# Patient Record
Sex: Male | Born: 1990 | Race: Black or African American | Hispanic: No | Marital: Single | Smoking: Never smoker
Health system: Southern US, Community
[De-identification: ages and names within clinical notes are randomized; demographics above are authoritative.]

## PROBLEM LIST (undated history)

## (undated) DIAGNOSIS — B20 Human immunodeficiency virus [HIV] disease: Secondary | ICD-10-CM

---

## 2014-04-01 ENCOUNTER — Emergency Department (HOSPITAL_COMMUNITY)
Admission: EM | Admit: 2014-04-01 | Discharge: 2014-04-01 | Disposition: A | Discharge status: Home / Self Care | Payer: Self-pay | Attending: Emergency Medicine | Admitting: Emergency Medicine

## 2014-04-01 ENCOUNTER — Encounter (HOSPITAL_COMMUNITY): Payer: Self-pay | Admitting: Emergency Medicine

## 2014-04-01 DIAGNOSIS — Z21 Asymptomatic human immunodeficiency virus [HIV] infection status: Secondary | ICD-10-CM | POA: Insufficient documentation

## 2014-04-01 DIAGNOSIS — S0990XA Unspecified injury of head, initial encounter: Secondary | ICD-10-CM | POA: Insufficient documentation

## 2014-04-01 DIAGNOSIS — G44209 Tension-type headache, unspecified, not intractable: Secondary | ICD-10-CM | POA: Insufficient documentation

## 2014-04-01 DIAGNOSIS — Y92410 Unspecified street and highway as the place of occurrence of the external cause: Secondary | ICD-10-CM | POA: Insufficient documentation

## 2014-04-01 DIAGNOSIS — Y9389 Activity, other specified: Secondary | ICD-10-CM | POA: Insufficient documentation

## 2014-04-01 DIAGNOSIS — Z880 Allergy status to penicillin: Secondary | ICD-10-CM | POA: Insufficient documentation

## 2014-04-01 HISTORY — DX: Human immunodeficiency virus (HIV) disease: B20

## 2014-04-01 MED ORDER — NAPROXEN 500 MG PO TABS
500.0000 mg | ORAL_TABLET | Freq: Once | ORAL | Status: AC
  Administered 2014-04-01: 500 mg via ORAL
  Filled 2014-04-01: qty 1

## 2014-04-01 MED ORDER — NAPROXEN 500 MG PO TABS: 500.0000 mg | ORAL_TABLET | Freq: Two times a day (BID) | ORAL | Status: AC
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
## 2014-04-01 NOTE — Discharge Instructions (Signed)
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
## 2014-04-02 NOTE — ED Provider Notes (Signed)
Medical screening examination/treatment/procedure(s) were performed by non-physician practitioner and as supervising physician I was immediately available for consultation/collaboration.    Shanna CiscoMegan E Docherty, MD 04/02/14 0001

## 2015-03-08 ENCOUNTER — Encounter (HOSPITAL_COMMUNITY): Payer: Self-pay | Admitting: Emergency Medicine

## 2015-03-08 ENCOUNTER — Emergency Department (HOSPITAL_COMMUNITY)
Admission: EM | Admit: 2015-03-08 | Discharge: 2015-03-08 | Disposition: A | Discharge status: Home / Self Care | Payer: Self-pay | Attending: Emergency Medicine | Admitting: Emergency Medicine

## 2015-03-08 DIAGNOSIS — R6889 Other general symptoms and signs: Secondary | ICD-10-CM

## 2015-03-08 DIAGNOSIS — R0981 Nasal congestion: Secondary | ICD-10-CM | POA: Insufficient documentation

## 2015-03-08 DIAGNOSIS — R05 Cough: Secondary | ICD-10-CM | POA: Insufficient documentation

## 2015-03-08 DIAGNOSIS — Z79899 Other long term (current) drug therapy: Secondary | ICD-10-CM | POA: Insufficient documentation

## 2015-03-08 DIAGNOSIS — Z21 Asymptomatic human immunodeficiency virus [HIV] infection status: Secondary | ICD-10-CM | POA: Insufficient documentation

## 2015-03-08 DIAGNOSIS — Z791 Long term (current) use of non-steroidal anti-inflammatories (NSAID): Secondary | ICD-10-CM | POA: Insufficient documentation

## 2015-03-08 DIAGNOSIS — R509 Fever, unspecified: Secondary | ICD-10-CM | POA: Insufficient documentation

## 2015-03-08 DIAGNOSIS — Z880 Allergy status to penicillin: Secondary | ICD-10-CM | POA: Insufficient documentation

## 2015-03-08 MED ORDER — DM-GUAIFENESIN ER 30-600 MG PO TB12: 1.0000 | ORAL_TABLET | Freq: Two times a day (BID) | ORAL | Status: AC

## 2015-03-08 MED ORDER — IBUPROFEN 800 MG PO TABS
800.0000 mg | ORAL_TABLET | Freq: Once | ORAL | Status: AC
Start: 2015-03-08 — End: 2015-03-08
  Administered 2015-03-08: 800 mg via ORAL
  Filled 2015-03-08: qty 1

## 2015-03-08 MED ORDER — SALINE SPRAY 0.65 % NA SOLN: 1.0000 | NASAL | Status: AC | PRN

## 2015-03-08 MED ORDER — BENZONATATE 100 MG PO CAPS: 100.0000 mg | ORAL_CAPSULE | Freq: Three times a day (TID) | ORAL | Status: AC

## 2015-03-08 MED ORDER — ACETAMINOPHEN 500 MG PO TABS: 500.0000 mg | ORAL_TABLET | Freq: Four times a day (QID) | ORAL | Status: AC | PRN

## 2015-03-08 NOTE — Discharge Instructions (Signed)

## 2015-03-08 NOTE — ED Provider Notes (Signed)
CSN: [id number]     Arrival date & time 03/08/15  2103 History  This chart was scribed for non-physician practitioner, Antony Madura, PA-C,working with Elwin Mocha, MD, by Karle Plumber, ED Scribe. This patient was seen in room WTR5/WTR5 and the patient's care was started at 10:06 PM.  Chief Complaint  Patient presents with  . Flu like symptoms    The history is provided by the patient and medical records. No language interpreter was used.    HPI Comments:  Ricardo Wade is a 24 y.o. male with PMHx of HIV who presents to the Emergency Department complaining of a productive cough of beige phlegm that began three days ago. He reports associated subjective fever, chills, mild diarrhea, body aches, congestion and HA that began two days ago. He states he has taken Catering manager and Tylenol with minimal relief of the symptoms. Pt reports he has been eating and drinking normally. He denies any modifying factors of the symptoms. He reports having sick contacts from a friend with similar symptoms. Denies nausea, vomiting, sore throat and otalgia. CD4 count > 500  Past Medical History  Diagnosis Date  . HIV (human immunodeficiency virus infection)    History reviewed. No pertinent past surgical history. History reviewed. No pertinent family history. History  Substance Use Topics  . Smoking status: Never Smoker   . Smokeless tobacco: Not on file  . Alcohol Use: No    Review of Systems  Constitutional: Positive for fever and chills.  HENT: Positive for congestion. Negative for ear pain and sore throat.   Respiratory: Positive for cough.   Gastrointestinal: Negative for nausea and vomiting.  All other systems reviewed and are negative.   Allergies  Penicillins  Home Medications   Prior to Admission medications   Medication Sig Start Date End Date Taking? Authorizing Provider  Emtricitab-Rilpivir-Tenofov DF 200-25-300 MG TABS Take 1 tablet by mouth daily.  01/20/15  Yes Historical Provider,  MD  Phenyleph-CPM-DM-Aspirin 7.06-16-09-325 MG TBEF Take 2 tablets by mouth every 6 (six) hours as needed (cough).   Yes Historical Provider, MD  acetaminophen (TYLENOL) 500 MG tablet Take 1 tablet (500 mg total) by mouth every 6 (six) hours as needed. 03/08/15   Antony Madura, PA-C  benzonatate (TESSALON) 100 MG capsule Take 1 capsule (100 mg total) by mouth every 8 (eight) hours. 03/08/15   Antony Madura, PA-C  dextromethorphan-guaiFENesin [facility] DM) 30-600 MG per 12 hr tablet Take 1 tablet by mouth 2 (two) times daily. 03/08/15   Antony Madura, PA-C  naproxen (NAPROSYN) 500 MG tablet Take 1 tablet (500 mg total) by mouth 2 (two) times daily. Patient not taking: Reported on 03/08/2015 04/01/14   Antony Madura, PA-C  sodium chloride (OCEAN) 0.65 % SOLN nasal spray Place 1 spray into both nostrils as needed for congestion. 03/08/15   Antony Madura, PA-C   Triage Vitals: BP 151/76 mmHg  Pulse 98  Temp(Src) 100.5 F (38.1 C) (Oral)  Resp 15  SpO2 100%  Physical Exam  Constitutional: He is oriented to person, place, and time. He appears well-developed and well-nourished. No distress.  Nontoxic/nonseptic appearing  HENT:  Head: Normocephalic and atraumatic.  Nose: Mucosal edema present. No rhinorrhea.  Mouth/Throat: Uvula is midline and oropharynx is clear and moist. No oropharyngeal exudate.  Oropharynx clear. Uvula midline. Patient tolerating secretions without difficulty.  Eyes: Conjunctivae and EOM are normal. Pupils are equal, round, and reactive to light. No scleral icterus.  Neck: Normal range of motion.  No nuchal rigidity or  meningismus  Cardiovascular: Normal rate, regular rhythm and intact distal pulses.   Pulmonary/Chest: Effort normal. No respiratory distress. He has no wheezes. He has no rales.  No accessory muscle use. Lungs clear. Chest expansion symmetric.  Musculoskeletal: Normal range of motion.  Neurological: He is alert and oriented to person, place, and time. He exhibits normal  muscle tone. Coordination normal.  GCS 15. Speech is goal oriented. No focal neurologic deficits appreciated. Patient ambulatory with steady gait.  Skin: Skin is warm and dry. No rash noted. He is not diaphoretic. No erythema. No pallor.  Psychiatric: He has a normal mood and affect. His behavior is normal.  Nursing note and vitals reviewed.   ED Course  Procedures (including critical care time) DIAGNOSTIC STUDIES: Oxygen Saturation is 100% on RA, normal by my interpretation.   COORDINATION OF CARE: 10:11 PM- Will treat symptomatically. Pt verbalizes understanding and agrees to plan.  Medications  ibuprofen (ADVIL,MOTRIN) tablet 800 mg (800 mg Oral Given 03/08/15 2201)    Labs Review Labs Reviewed - No data to display  Imaging Review No results found.   EKG Interpretation None      MDM   Final diagnoses:  Flu-like symptoms    Patient with symptoms consistent with influenza. Vitals are stable, low-grade fever responding well to antipyretics. No signs of dehydration, tolerating PO's. Lungs are clear. Doubt PNA given lack of tachypnea, dyspnea, or hypoxia. Discussed the cost versus benefit of Tamiflu treatment with the patient. The patient understands that symptoms are greater than the recommended 24-48 hour window of treatment.  Patient will be discharged with instructions to orally hydrate, rest, and use over-the-counter medications for fever and body aches. Patient given Tessalon for cough and Mucinex D for congestion. Primary care follow advised and return precautions provided. Patient agreeable to plan with no unaddressed concerns.  I personally performed the services described in this documentation, which was scribed in my presence. The recorded information has been reviewed and is accurate.   Filed Vitals:   03/08/15 2136 03/08/15 2225  BP: 151/76 134/73  Pulse: 98 86  Temp: 100.5 F (38.1 C) 99.3 F (37.4 C)  TempSrc: Oral Oral  Resp: 15 20  SpO2: 100% 95%      Antony MaduraKelly Amen Dargis, PA-C 03/08/15 2302  Elwin MochaBlair Walden, MD 03/08/15 [phone]

## 2015-03-08 NOTE — ED Notes (Addendum)
Pt reports having headache and cough starting wed. Pt states he was around someone with the flu. Pt states at times he has muscle aches and runs a fever.Pt states he took Tylenol around 815pm tonight.

## 2017-01-11 ENCOUNTER — Ambulatory Visit (HOSPITAL_COMMUNITY)
Admission: EM | Admit: 2017-01-11 | Discharge: 2017-01-11 | Disposition: A | Discharge status: Home / Self Care | Payer: BC Managed Care – PPO | Attending: Family Medicine | Admitting: Family Medicine

## 2017-01-11 ENCOUNTER — Ambulatory Visit (INDEPENDENT_AMBULATORY_CARE_PROVIDER_SITE_OTHER): Payer: BC Managed Care – PPO

## 2017-01-11 ENCOUNTER — Encounter (HOSPITAL_COMMUNITY): Payer: Self-pay | Admitting: Emergency Medicine

## 2017-01-11 DIAGNOSIS — S92911D Unspecified fracture of right toe(s), subsequent encounter for fracture with routine healing: Secondary | ICD-10-CM | POA: Diagnosis not present

## 2017-01-11 DIAGNOSIS — S92911A Unspecified fracture of right toe(s), initial encounter for closed fracture: Secondary | ICD-10-CM

## 2017-01-11 NOTE — ED Provider Notes (Signed)
[facility]    CSN: [id number] Arrival date & time: 01/11/17  1720     History   Chief Complaint Chief Complaint  Patient presents with  . Foot Pain    HPI Ricardo Wade is a 26 y.o. male.   Right toe (next to little toe) is painful, discolored.  Stubbed toe on a chair on sunday      Past Medical History:  Diagnosis Date  . HIV (human immunodeficiency virus infection) (HCC)     There are no active problems to display for this patient.   History reviewed. No pertinent surgical history.     Home Medications    Prior to Admission medications   Medication Sig Start Date End Date Taking? Authorizing Provider  acetaminophen (TYLENOL) 500 MG tablet Take 1 tablet (500 mg total) by mouth every 6 (six) hours as needed. 03/08/15   Antony MaduraKelly Humes, PA-C  benzonatate (TESSALON) 100 MG capsule Take 1 capsule (100 mg total) by mouth every 8 (eight) hours. 03/08/15   Antony MaduraKelly Humes, PA-C  dextromethorphan-guaiFENesin [facility](MUCINEX DM) 30-600 MG per 12 hr tablet Take 1 tablet by mouth 2 (two) times daily. 03/08/15   Antony MaduraKelly Humes, PA-C  Emtricitab-Rilpivir-Tenofov DF 200-25-300 MG TABS Take 1 tablet by mouth daily.  01/20/15   Historical Provider, MD  naproxen (NAPROSYN) 500 MG tablet Take 1 tablet (500 mg total) by mouth 2 (two) times daily. Patient not taking: Reported on 03/08/2015 04/01/14   Antony MaduraKelly Humes, PA-C  Phenyleph-CPM-DM-Aspirin 7.06-16-09-325 MG TBEF Take 2 tablets by mouth every 6 (six) hours as needed (cough).    Historical Provider, MD  sodium chloride (OCEAN) 0.65 % SOLN nasal spray Place 1 spray into both nostrils as needed for congestion. 03/08/15   Antony MaduraKelly Humes, PA-C    Family History No family history on file.  Social History Social History  Substance Use Topics  . Smoking status: Never Smoker  . Smokeless tobacco: Not on file  . Alcohol use No     Allergies   Penicillins   Review of Systems Review of Systems  Constitutional: Negative.   Musculoskeletal:  Positive for gait problem.     Physical Exam Triage Vital Signs ED Triage Vitals [01/11/17 1758]  Enc Vitals Group     BP      Pulse      Resp      Temp      Temp src      SpO2      Weight      Height      Head Circumference      Peak Flow      Pain Score 8     Pain Loc      Pain Edu?      Excl. in GC?    No data found.   Updated Vital Signs BP 154/88 (BP Location: Right Arm)   Pulse 81   Temp 98.5 F (36.9 C) (Oral)   Resp 18   SpO2 99%    Physical Exam  Constitutional: He is oriented to person, place, and time. He appears well-developed and well-nourished.  HENT:  Right Ear: External ear normal.  Left Ear: External ear normal.  Mouth/Throat: Oropharynx is clear and moist.  Eyes: Conjunctivae and EOM are normal. Pupils are equal, round, and reactive to light.  Neck: Normal range of motion. Neck supple.  Pulmonary/Chest: Effort normal.  Neurological: He is alert and oriented to person, place, and time.  Skin: Skin is warm and dry.  Nursing  note and vitals reviewed.    UC Treatments / Results  Labs (all labs ordered are listed, but only abnormal results are displayed) Labs Reviewed - No data to display  EKG  EKG Interpretation None       Radiology Nondisplaced spiral fx left 4th toe.  Procedures Procedures (including critical care time)  Medications Ordered in UC Medications - No data to display   Initial Impression / Assessment and Plan / UC Course  I have reviewed the triage vital signs and the nursing notes.  Pertinent labs & imaging results that were available during my care of the patient were reviewed by me and considered in my medical decision making (see chart for details).     Final Clinical Impressions(s) / UC Diagnoses   Final diagnoses:  Closed fracture dislocation of interphalangeal joint of single toe with routine healing, right    New Prescriptions New Prescriptions   No medications on file  Ibuprofen/post-op  shoe.   Elvina Sidle, MD 01/11/17 [phone]

## 2017-01-11 NOTE — Discharge Instructions (Signed)
This type of fracture typically requires 4 to 6 weeks of healing.  Wear the post-op shoe until mid-April.  If pain is still a problem at the end of March, please return for further evaluation.

## 2017-01-11 NOTE — ED Triage Notes (Signed)
Right toe (next to little toe) is painful, discolored.  Stumped toe on a chair on sunday

## 2017-08-07 IMAGING — DX DG FOOT COMPLETE 3+V*R*
3 series · 3 of 3 positions shown · non-contrast
Comparison: None.

CLINICAL DATA: Patient struck his right foot on a chair 4 days ago
with continued pain of the fourth toe. Initial encounter.

EXAM:
RIGHT FOOT COMPLETE - 3+ VIEW

[foot ap]
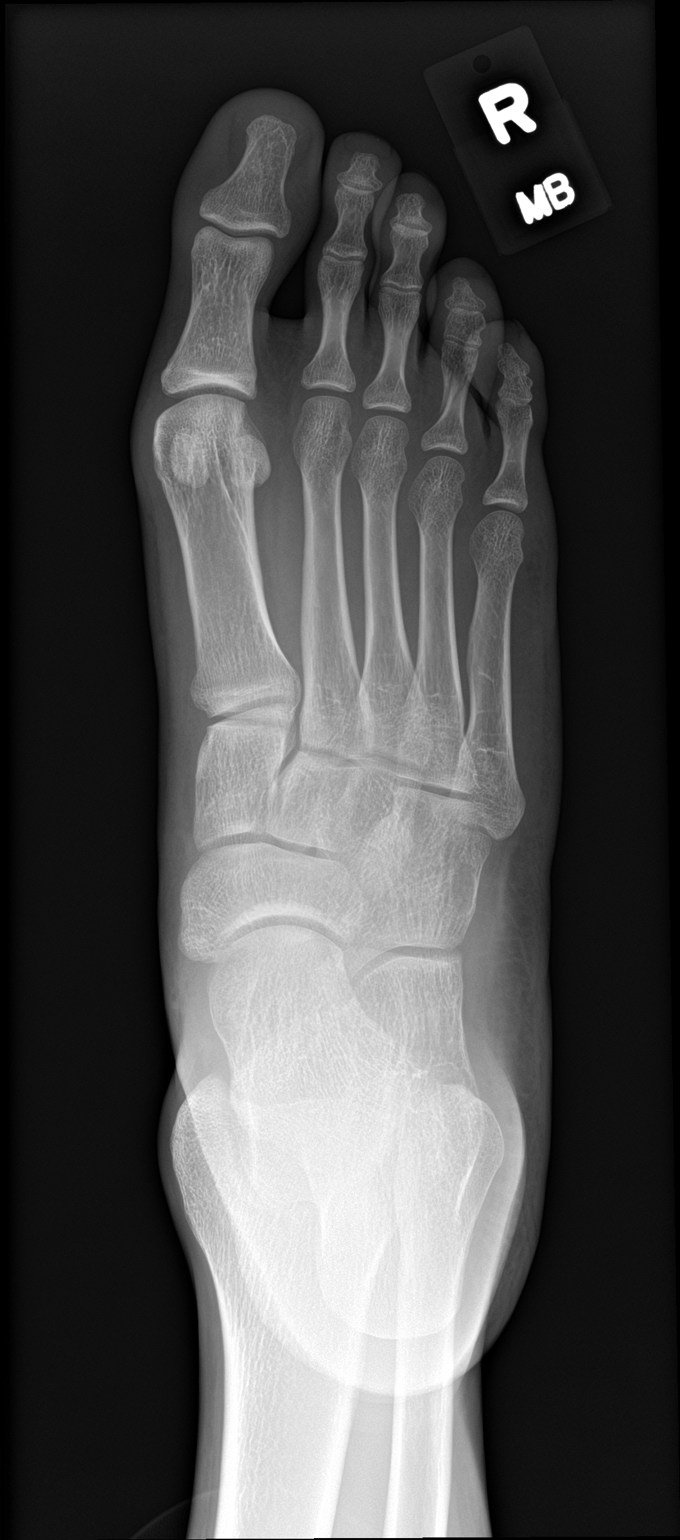

[foot obl]
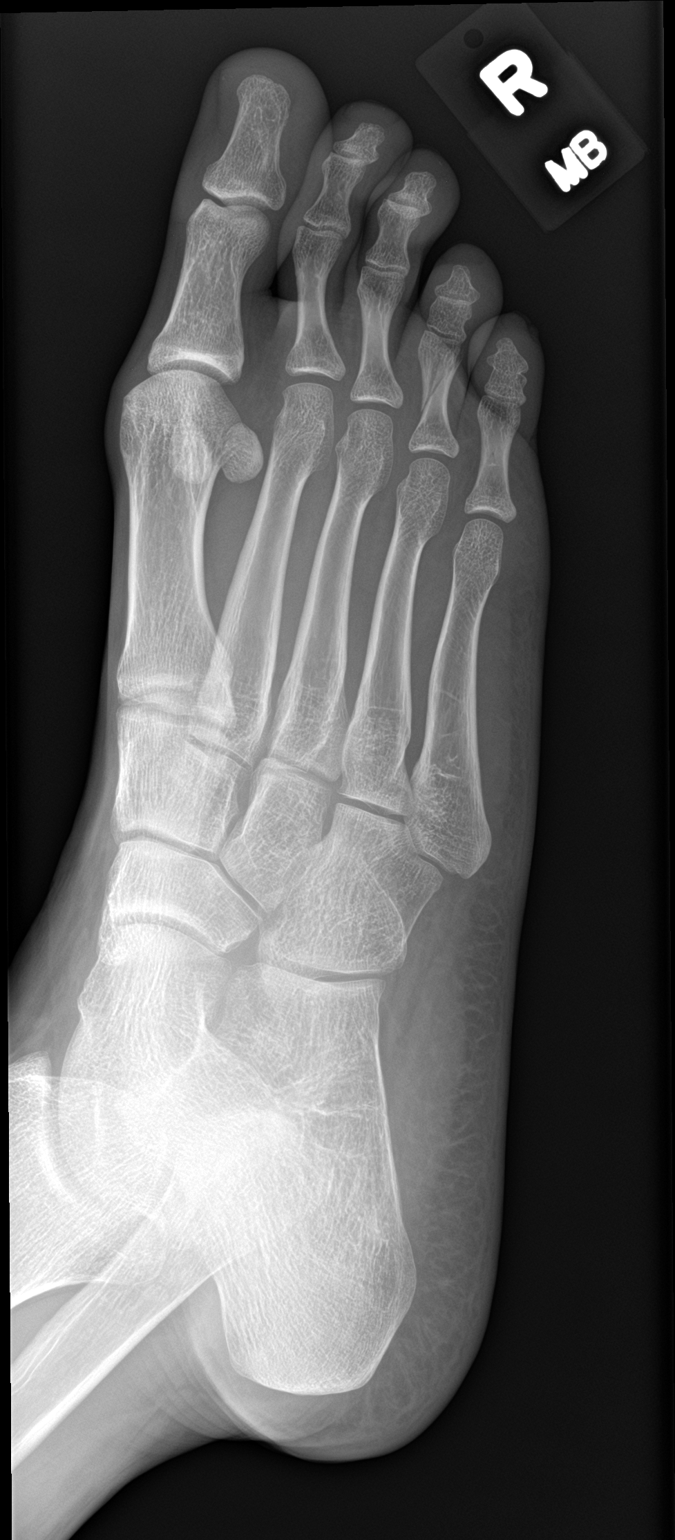

[foot lat]
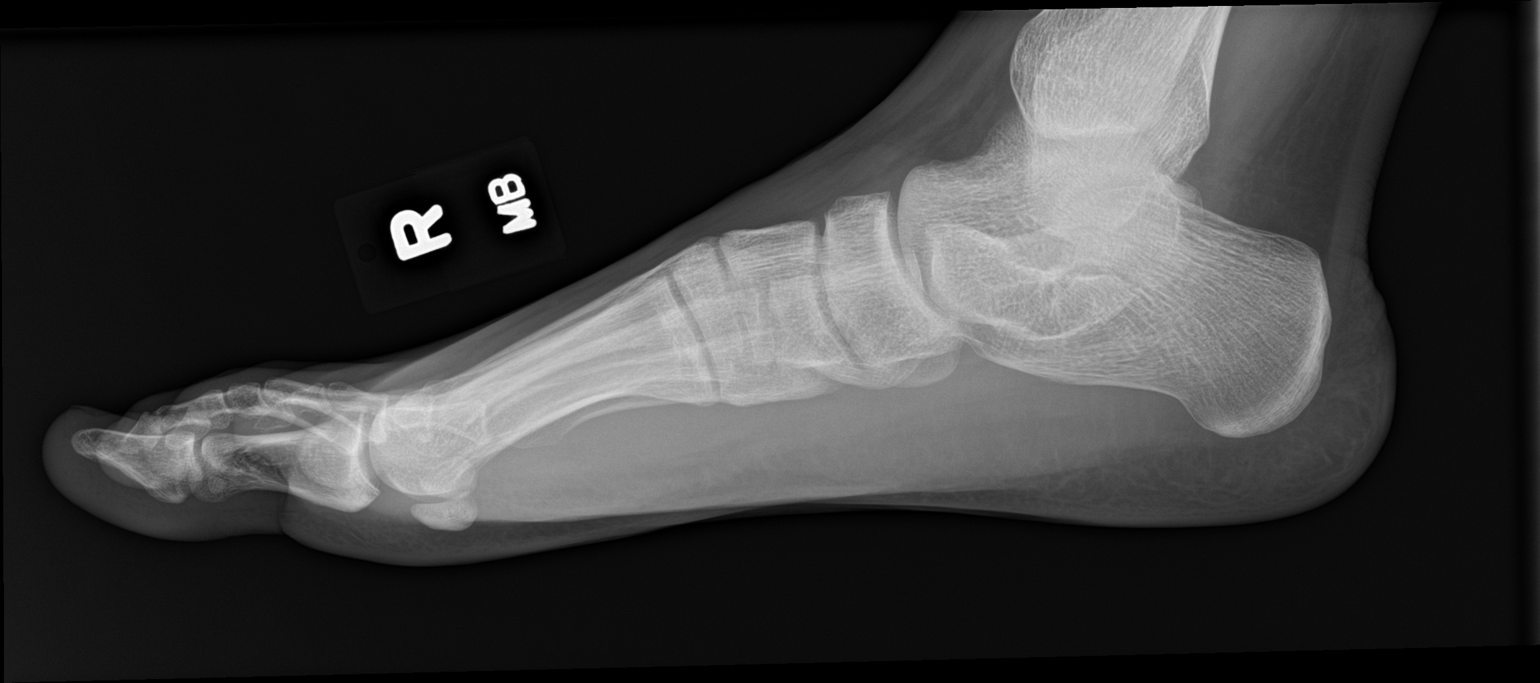

[3 of 3 positions shown; findings below may reference images not displayed]

FINDINGS: The patient has an oblique fracture of the proximal phalanx of the
fourth toe. The fracture is nondisplaced and extends from the
proximal metaphysis on the medial side through the lateral aspect of
the distal metaphysis. No other acute bony or joint abnormality is
seen.
IMPRESSION: Nondisplaced fracture proximal phalanx fourth toe.
